# Patient Record
Sex: Male | Born: 1990 | Race: Black or African American | Hispanic: No | Marital: Single | Smoking: Never smoker
Health system: Southern US, Community
[De-identification: ages and names within clinical notes are randomized; demographics above are authoritative.]

## PROBLEM LIST (undated history)

## (undated) DIAGNOSIS — T148XXA Other injury of unspecified body region, initial encounter: Secondary | ICD-10-CM

## (undated) HISTORY — PX: HYDROCELE EXCISION: SHX482

---
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
## 2010-09-30 ENCOUNTER — Emergency Department (HOSPITAL_COMMUNITY)
Admission: EM | Admit: 2010-09-30 | Discharge: 2010-10-01 | Disposition: A | Discharge status: Home / Self Care | Payer: Self-pay | Attending: Emergency Medicine | Admitting: Emergency Medicine

## 2010-09-30 DIAGNOSIS — N5089 Other specified disorders of the male genital organs: Secondary | ICD-10-CM | POA: Insufficient documentation

## 2010-09-30 DIAGNOSIS — T7840XA Allergy, unspecified, initial encounter: Secondary | ICD-10-CM | POA: Insufficient documentation

## 2010-09-30 DIAGNOSIS — X58XXXA Exposure to other specified factors, initial encounter: Secondary | ICD-10-CM | POA: Insufficient documentation

## 2010-09-30 DIAGNOSIS — L500 Allergic urticaria: Secondary | ICD-10-CM | POA: Insufficient documentation

## 2010-10-01 ENCOUNTER — Emergency Department (HOSPITAL_COMMUNITY): Payer: Self-pay

## 2011-09-24 ENCOUNTER — Encounter (HOSPITAL_COMMUNITY): Payer: Self-pay | Admitting: Emergency Medicine

## 2011-09-24 ENCOUNTER — Emergency Department (HOSPITAL_COMMUNITY)
Admission: EM | Admit: 2011-09-24 | Discharge: 2011-09-24 | Disposition: A | Discharge status: Home / Self Care | Payer: Self-pay | Attending: Emergency Medicine | Admitting: Emergency Medicine

## 2011-09-24 DIAGNOSIS — M545 Low back pain, unspecified: Secondary | ICD-10-CM | POA: Insufficient documentation

## 2011-09-24 DIAGNOSIS — J029 Acute pharyngitis, unspecified: Secondary | ICD-10-CM

## 2011-09-24 DIAGNOSIS — R070 Pain in throat: Secondary | ICD-10-CM | POA: Insufficient documentation

## 2011-09-24 HISTORY — DX: Other injury of unspecified body region, initial encounter: T14.8XXA

## 2011-09-24 LAB — RAPID STREP SCREEN (MED CTR MEBANE ONLY): Streptococcus, Group A Screen (Direct): NEGATIVE

## 2011-09-24 MED ORDER — HYDROCODONE-ACETAMINOPHEN 7.5-500 MG/15ML PO SOLN: 15.0000 mL | Freq: Four times a day (QID) | ORAL | Status: AC | PRN

## 2011-09-24 MED ORDER — HYDROCODONE-ACETAMINOPHEN 7.5-500 MG/15ML PO SOLN
10.0000 mL | Freq: Once | ORAL | Status: AC
  Administered 2011-09-24: 10 mL via ORAL
  Filled 2011-09-24: qty 15

## 2011-09-24 NOTE — ED Notes (Signed)
Pt stated he had a cold 2 weeks ago that went ago. Current complaints are sore throat, hard to swallow. Pt had n/v yesterday. Legs hurting and back is hurting per pt. No appetite.

## 2011-09-24 NOTE — ED Provider Notes (Signed)
History     CSN: [id number]  Arrival date & time 09/24/11  1519   First MD Initiated Contact with Patient 09/24/11 1608      Chief Complaint  Patient presents with  . Sore Throat  . Back Pain  . Chills    (Consider location/radiation/quality/duration/timing/severity/associated sxs/prior treatment) HPI  21 year old male presents complaining of sore throat. Patient states for the past 3 days he has been having fever, chills, body aches, sore throat worsening with swallowing, decrease in appetite, nausea and one bout of nonbilious nonbloody vomit yesterday. He also experiencing body aches especially to his low back. He denies headache, runny nose, sneezing, cough, chest pain, shortness of breath, abdominal pain, or rash. He denies any recent sick contact. He has tried over-the-counter Mucinex and Tylenol PM without adequate relief.  Past Medical History  Diagnosis Date  . Fracture     left wrist     Past Surgical History  Procedure Date  . Hydrocele excision     No family history on file.  History  Substance Use Topics  . Smoking status: Not on file  . Smokeless tobacco: Not on file  . Alcohol Use:       Review of Systems  All other systems reviewed and are negative.    Allergies  Bee venom  Home Medications   Current Outpatient Rx  Name Route Sig Dispense Refill  . ACETAMINOPHEN 500 MG PO TABS Oral Take 500 mg by mouth every 6 (six) hours as needed. Pain    . DM-GUAIFENESIN ER 30-600 MG PO TB12 Oral Take 1 tablet by mouth every 12 (twelve) hours. Cold    . DIPHENHYDRAMINE-APAP (SLEEP) 25-500 MG PO TABS Oral Take 1 tablet by mouth at bedtime as needed. Pain      BP 132/49  Pulse 77  Temp 99.7 F (37.6 C) (Oral)  Resp 16  Ht 5\' 9"  (1.753 m)  Wt 165 lb (74.844 kg)  BMI 24.37 kg/m2  SpO2 100%  Physical Exam  Nursing note and vitals reviewed. Constitutional: He is oriented to person, place, and time. He appears well-developed and well-nourished. No  distress.  HENT:  Head: Normocephalic and atraumatic. No trismus in the jaw.  Right Ear: External ear normal.  Left Ear: External ear normal.  Mouth/Throat: Uvula is midline and mucous membranes are normal. No uvula swelling. Oropharyngeal exudate and posterior oropharyngeal edema present. No posterior oropharyngeal erythema.    Eyes: Conjunctivae are normal. No scleral icterus.  Neck: Normal range of motion. Neck supple. No JVD present. No tracheal deviation present.  Cardiovascular: Normal rate and regular rhythm.   Pulmonary/Chest: Effort normal and breath sounds normal. No stridor. No respiratory distress. He has no wheezes.  Abdominal: Soft. There is no tenderness. There is no rebound and no guarding.  Lymphadenopathy:    He has cervical adenopathy.  Neurological: He is alert and oriented to person, place, and time.  Skin: Skin is warm. No rash noted.    ED Course  Procedures (including critical care time)  Labs Reviewed - No data to display No results found.   No diagnosis found.  Results for orders placed during the hospital encounter of 09/24/11  RAPID STREP SCREEN      Component Value Range   Streptococcus, Group A Screen (Direct) NEGATIVE  NEGATIVE   No results found.    MDM  Patient presents with sore throat. He meets Centor criteria.  Rapid strep test sent.  No airway compromise.  No splenomegaly.  Lortab given.    Pt educate on avoiding using mult. Source of acetaminophen to prevent overdosing.     5:08 PM Strep test negative.  Result discussed.  Care instruction given.  Pt voice understanding.     Fayrene Helper, PA-C 09/24/11 1709

## 2011-09-24 NOTE — ED Provider Notes (Signed)
Medical screening examination/treatment/procedure(s) were performed by non-physician practitioner and as supervising physician I was immediately available for consultation/collaboration.  Juliet Rude. Rubin Payor, MD 09/24/11 2352

## 2011-09-24 NOTE — Discharge Instructions (Signed)
Pharyngitis, Viral and Bacterial  Pharyngitis is soreness (inflammation) or infection of the pharynx. It is also called a sore throat.  CAUSES   Most sore throats are caused by viruses and are part of a cold. However, some sore throats are caused by strep and other bacteria. Sore throats can also be caused by post nasal drip from draining sinuses, allergies and sometimes from sleeping with an open mouth. Infectious sore throats can be spread from person to person by coughing, sneezing and sharing cups or eating utensils.  TREATMENT   Sore throats that are viral usually last 3-4 days. Viral illness will get better without medications (antibiotics). Strep throat and other bacterial infections will usually begin to get better about 24-48 hours after you begin to take antibiotics.  HOME CARE INSTRUCTIONS   · If the caregiver feels there is a bacterial infection or if there is a positive strep test, they will prescribe an antibiotic. The full course of antibiotics must be taken. If the full course of antibiotic is not taken, you or your child may become ill again. If you or your child has strep throat and do not finish all of the medication, serious heart or kidney diseases may develop.  · Drink enough water and fluids to keep your urine clear or pale yellow.  · Only take over-the-counter or prescription medicines for pain, discomfort or fever as directed by your caregiver.  · Get lots of rest.  · Gargle with salt water (½ tsp. of salt in a glass of water) as often as every 1-2 hours as you need for comfort.  · Hard candies may soothe the throat if individual is not at risk for choking. Throat sprays or lozenges may also be used.  SEEK MEDICAL CARE IF:   · Large, tender lumps in the neck develop.  · A rash develops.  · Green, yellow-brown or bloody sputum is coughed up.  · Your baby is older than 3 months with a rectal temperature of 100.5° F (38.1° C) or higher for more than 1 day.  SEEK IMMEDIATE MEDICAL CARE IF:   · A  stiff neck develops.  · You or your child are drooling or unable to swallow liquids.  · You or your child are vomiting, unable to keep medications or liquids down.  · You or your child has severe pain, unrelieved with recommended medications.  · You or your child are having difficulty breathing (not due to stuffy nose).  · You or your child are unable to fully open your mouth.  · You or your child develop redness, swelling, or severe pain anywhere on the neck.  · You have a fever.  · Your baby is older than 3 months with a rectal temperature of 102° F (38.9° C) or higher.  · Your baby is 3 months old or younger with a rectal temperature of 100.4° F (38° C) or higher.  MAKE SURE YOU:   · Understand these instructions.  · Will watch your condition.  · Will get help right away if you are not doing well or get worse.  Document Released: 03/23/2005 Document Revised: 03/12/2011 Document Reviewed: 06/20/2007  [employer]® Patient Information ©2012 [employer].  Salt Water Gargle  This solution will help make your mouth and throat feel better.  HOME CARE INSTRUCTIONS   · Mix 1 teaspoon of salt in 8 ounces of warm water.  · Gargle with this solution as much or often as you need or as directed. Swish and   gargle gently if you have any sores or wounds in your mouth.  · Do not swallow this mixture.  Document Released: 12/26/2003 Document Revised: 03/12/2011 Document Reviewed: 05/18/2008  ExitCare® Patient Information ©2012 [employer].

## 2012-11-28 ENCOUNTER — Emergency Department (HOSPITAL_COMMUNITY): Payer: No Typology Code available for payment source

## 2012-11-28 ENCOUNTER — Emergency Department (HOSPITAL_COMMUNITY)
Admission: EM | Admit: 2012-11-28 | Discharge: 2012-11-28 | Disposition: A | Discharge status: Home / Self Care | Payer: No Typology Code available for payment source | Attending: Emergency Medicine | Admitting: Emergency Medicine

## 2012-11-28 ENCOUNTER — Encounter (HOSPITAL_COMMUNITY): Payer: Self-pay | Admitting: Emergency Medicine

## 2012-11-28 DIAGNOSIS — M542 Cervicalgia: Secondary | ICD-10-CM

## 2012-11-28 DIAGNOSIS — Y9389 Activity, other specified: Secondary | ICD-10-CM | POA: Insufficient documentation

## 2012-11-28 DIAGNOSIS — M62838 Other muscle spasm: Secondary | ICD-10-CM

## 2012-11-28 DIAGNOSIS — M5380 Other specified dorsopathies, site unspecified: Secondary | ICD-10-CM | POA: Insufficient documentation

## 2012-11-28 DIAGNOSIS — S0993XA Unspecified injury of face, initial encounter: Secondary | ICD-10-CM | POA: Insufficient documentation

## 2012-11-28 DIAGNOSIS — Z8781 Personal history of (healed) traumatic fracture: Secondary | ICD-10-CM | POA: Insufficient documentation

## 2012-11-28 DIAGNOSIS — Y92410 Unspecified street and highway as the place of occurrence of the external cause: Secondary | ICD-10-CM | POA: Insufficient documentation

## 2012-11-28 MED ORDER — DIAZEPAM 5 MG PO TABS: 5.0000 mg | ORAL_TABLET | Freq: Once | ORAL | Status: DC

## 2012-11-28 MED ORDER — HYDROCODONE-ACETAMINOPHEN 5-325 MG PO TABS: 2.0000 | ORAL_TABLET | Freq: Once | ORAL | Status: DC

## 2012-11-28 MED ORDER — DIAZEPAM 5 MG PO TABS
5.0000 mg | ORAL_TABLET | Freq: Once | ORAL | Status: AC
  Administered 2012-11-28: 5 mg via ORAL
  Filled 2012-11-28: qty 1

## 2012-11-28 MED ORDER — HYDROCODONE-ACETAMINOPHEN 5-325 MG PO TABS
2.0000 | ORAL_TABLET | Freq: Once | ORAL | Status: AC
  Administered 2012-11-28: 2 via ORAL
  Filled 2012-11-28: qty 2
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
## 2013-03-07 ENCOUNTER — Ambulatory Visit (INDEPENDENT_AMBULATORY_CARE_PROVIDER_SITE_OTHER): Payer: Managed Care, Other (non HMO) | Admitting: Family Medicine

## 2013-03-07 VITALS — BP 108/76 | HR 50 | Temp 97.7°F | Resp 16 | Ht 69.0 in | Wt 170.4 lb

## 2013-03-07 DIAGNOSIS — T7840XA Allergy, unspecified, initial encounter: Secondary | ICD-10-CM

## 2013-03-07 DIAGNOSIS — N50819 Testicular pain, unspecified: Secondary | ICD-10-CM

## 2013-03-07 DIAGNOSIS — L500 Allergic urticaria: Secondary | ICD-10-CM

## 2013-03-07 DIAGNOSIS — N509 Disorder of male genital organs, unspecified: Secondary | ICD-10-CM

## 2013-03-07 DIAGNOSIS — Z79899 Other long term (current) drug therapy: Secondary | ICD-10-CM

## 2013-03-07 LAB — GLUCOSE, POCT (MANUAL RESULT ENTRY): POC Glucose: 88 mg/dl (ref 70–99)

## 2013-03-07 MED ORDER — NON FORMULARY
150.0000 mg | Freq: Once | Status: AC
  Administered 2013-03-07: 150 mg via ORAL

## 2013-03-07 MED ORDER — METHYLPREDNISOLONE ACETATE 80 MG/ML IJ SUSP
120.0000 mg | Freq: Once | INTRAMUSCULAR | Status: AC
  Administered 2013-03-07: 120 mg via INTRAMUSCULAR

## 2013-03-07 MED ORDER — DIPHENHYDRAMINE HCL 50 MG/ML IJ SOLN
50.0000 mg | Freq: Once | INTRAMUSCULAR | Status: AC
  Administered 2013-03-07: 50 mg via INTRAVENOUS

## 2013-03-07 NOTE — Patient Instructions (Addendum)
Continue the benadryl 2 pills every 4 hours as needed today and into tomorrow.  The steroid should be helping with the reaction by the morning. Continue zantac over the counter - 150mg  twice per day next 5-7 days. Avoid contact with soap that may have led to your reaction, but Return to the clinic or go to the nearest emergency room if any of your symptoms worsen or new symptoms occur.  We will check a urine test to make sure the soreness in the testicle is not due to an infection, but if this soreness in the actual testicle does not improve, or certainly if any worsening tonight - go to the emergency room.  Contact Dermatitis Contact dermatitis is a reaction to certain substances that touch the skin. Contact dermatitis can be either irritant contact dermatitis or allergic contact dermatitis. Irritant contact dermatitis does not require previous exposure to the substance for a reaction to occur.Allergic contact dermatitis only occurs if you have been exposed to the substance before. Upon a repeat exposure, your body reacts to the substance.  CAUSES  Many substances can cause contact dermatitis. Irritant dermatitis is most commonly caused by repeated exposure to mildly irritating substances, such as:  Makeup.  Soaps.  Detergents.  Bleaches.  Acids.  Metal salts, such as nickel. Allergic contact dermatitis is most commonly caused by exposure to:  Poisonous plants.  Chemicals (deodorants, shampoos).  Jewelry.  Latex.  Neomycin in triple antibiotic cream.  Preservatives in products, including clothing. SYMPTOMS  The area of skin that is exposed may develop:  Dryness or flaking.  Redness.  Cracks.  Itching.  Pain or a burning sensation.  Blisters. With allergic contact dermatitis, there may also be swelling in areas such as the eyelids, mouth, or genitals.  DIAGNOSIS  Your caregiver can usually tell what the problem is by doing a physical exam. In cases where the cause is  uncertain and an allergic contact dermatitis is suspected, a patch skin test may be performed to help determine the cause of your dermatitis. TREATMENT Treatment includes protecting the skin from further contact with the irritating substance by avoiding that substance if possible. Barrier creams, powders, and gloves may be helpful. Your caregiver may also recommend:  Steroid creams or ointments applied 2 times daily. For best results, soak the rash area in cool water for 20 minutes. Then apply the medicine. Cover the area with a plastic wrap. You can store the steroid cream in the refrigerator for a "chilly" effect on your rash. That may decrease itching. Oral steroid medicines may be needed in more severe cases.  Antibiotics or antibacterial ointments if a skin infection is present.  Antihistamine lotion or an antihistamine taken by mouth to ease itching.  Lubricants to keep moisture in your skin.  Burow's solution to reduce redness and soreness or to dry a weeping rash. Mix one packet or tablet of solution in 2 cups cool water. Dip a clean washcloth in the mixture, wring it out a bit, and put it on the affected area. Leave the cloth in place for 30 minutes. Do this as often as possible throughout the day.  Taking several cornstarch or baking soda baths daily if the area is too large to cover with a washcloth. Harsh chemicals, such as alkalis or acids, can cause skin damage that is like a burn. You should flush your skin for 15 to 20 minutes with cold water after such an exposure. You should also seek immediate medical care after exposure. Bandages (  dressings), antibiotics, and pain medicine may be needed for severely irritated skin.  HOME CARE INSTRUCTIONS  Avoid the substance that caused your reaction.  Keep the area of skin that is affected away from hot water, soap, sunlight, chemicals, acidic substances, or anything else that would irritate your skin.  Do not scratch the rash. Scratching  may cause the rash to become infected.  You may take cool baths to help stop the itching.  Only take over-the-counter or prescription medicines as directed by your caregiver.  See your caregiver for follow-up care as directed to make sure your skin is healing properly. SEEK MEDICAL CARE IF:   Your condition is not better after 3 days of treatment.  You seem to be getting worse.  You see signs of infection such as swelling, tenderness, redness, soreness, or warmth in the affected area.  You have any problems related to your medicines. Document Released: 03/20/2000 Document Revised: 06/15/2011 Document Reviewed: 08/26/2010 [facility] Patient Information 2014 [place], [place]. Hives Hives are itchy, red, swollen areas of the skin. They can vary in size and location on your body. Hives can come and go for hours or several days (acute hives) or for several weeks (chronic hives). Hives do not spread from person to person (noncontagious). They may get worse with scratching, exercise, and emotional stress. CAUSES   Allergic reaction to food, additives, or drugs.  Infections, including the common cold.  Illness, such as vasculitis, lupus, or thyroid disease.  Exposure to sunlight, heat, or cold.  Exercise.  Stress.  Contact with chemicals. SYMPTOMS   Red or white swollen patches on the skin. The patches may change size, shape, and location quickly and repeatedly.  Itching.  Swelling of the hands, feet, and face. This may occur if hives develop deeper in the skin. DIAGNOSIS  Your caregiver can usually tell what is wrong by performing a physical exam. Skin or blood tests may also be done to determine the cause of your hives. In some cases, the cause cannot be determined. TREATMENT  Mild cases usually get better with medicines such as antihistamines. Severe cases may require an emergency epinephrine injection. If the cause of your hives is known, treatment includes avoiding that  trigger.  HOME CARE INSTRUCTIONS   Avoid causes that trigger your hives.  Take antihistamines as directed by your caregiver to reduce the severity of your hives. Non-sedating or low-sedating antihistamines are usually recommended. Do not drive while taking an antihistamine.  Take any other medicines prescribed for itching as directed by your caregiver.  Wear loose-fitting clothing.  Keep all follow-up appointments as directed by your caregiver. SEEK MEDICAL CARE IF:   You have persistent or severe itching that is not relieved with medicine.  You have painful or swollen joints. SEEK IMMEDIATE MEDICAL CARE IF:   You have a fever.  Your tongue or lips are swollen.  You have trouble breathing or swallowing.  You feel tightness in the throat or chest.  You have abdominal pain. These problems may be the first sign of a life-threatening allergic reaction. Call your local emergency services (911 in [place] MAKE SURE YOU:   Understand these instructions.  Will watch your condition.  Will get help right away if you are not doing well or get worse. Document Released: 03/23/2005 Document Revised: 09/22/2011 Document Reviewed: 06/16/2011 [facility] Patient Information 2014 [place], [place].

## 2013-03-07 NOTE — Progress Notes (Addendum)
Subjective:    Patient ID: Dustin Yang, male    DOB: 12/31/1990, 22 y.o.   MRN: [id number]  This chart was scribed for Meredith Staggers, MD by Greggory Stallion, Medical Scribe. This patient's care was started at 3:16 PM.  HPI HPI Comments: Dustin Yang is a 22 y.o. male who presents to the office complaining of itchy rash on his genitals with associated scrotal swelling that started around 11:40 AM today. He states the rash started on his scrotum then moved to his penis, legs, back and arms. Pt states he was SOB in the waiting room but denies it currently. He started using his girlfriend's Olay body wash recently. Denies using any other new soaps or lotions. Pt also took two muscle relaxer's this morning that his chiropractor prescribed him but states he has taken them before in the past. He had similar symptoms such as these about one year ago due to sensitive skin. Denies trouble breathing, no mouth lesions.   Monogamous with GF, no hx of STI's.  Testicle/scrotum only sore past few hours with current sx's.   There are no active problems to display for this patient.  Past Medical History  Diagnosis Date  . Fracture     left wrist    Past Surgical History  Procedure Laterality Date  . Hydrocele excision     Allergies  Allergen Reactions  . Bee Venom Anaphylaxis   Prior to Admission medications   Medication Sig Start Date End Date Taking? Authorizing Provider  diazepam (VALIUM) 5 MG tablet Take 1 tablet (5 mg total) by mouth once. 11/28/12   Dagmar Hait, MD  HYDROcodone-acetaminophen (NORCO/VICODIN) 5-325 MG per tablet Take 2 tablets by mouth once. 11/28/12   Dagmar Hait, MD   History   Social History  . Marital Status: Single    Spouse Name: N/A    Number of Children: N/A  . Years of Education: N/A   Occupational History  . Not on file.   Social History Main Topics  . Smoking status: Never Smoker   . Smokeless tobacco: Never Used  . Alcohol Use: No  .  Drug Use: 3.00 per week    Special: Marijuana  . Sexual Activity: Not on file   Other Topics Concern  . Not on file   Social History Narrative  . No narrative on file    Review of Systems  HENT: Negative for mouth sores.   Respiratory: Negative for shortness of breath.   Genitourinary: Positive for scrotal swelling.  Skin: Positive for rash.       Objective:   Physical Exam  Vitals reviewed. Constitutional: He is oriented to person, place, and time. He appears well-developed and well-nourished. No distress.  HENT:  Head: Normocephalic and atraumatic.  No mucosal lesions. Swallowing secretions normally.   Eyes: EOM are normal.  Cardiovascular: Normal rate, regular rhythm and normal heart sounds.   No murmur heard. Pulmonary/Chest: Effort normal and breath sounds normal. No stridor. He has no wheezes. He has no rhonchi.  No stridor.  Genitourinary:  No focal lesions noted on scrotum. Diffuse scrotal edema. Slight soreness to left testicles/epididymus. No penile swelling or discharge.   Lymphadenopathy:    He has no cervical adenopathy.  Neurological: He is alert and oriented to person, place, and time.  Skin: Skin is warm and dry. Rash noted.  Urticaria on medial thighs bilaterally. Few around the scrotum, right and left buttocks. A few small urticaria on abdomen with some  excoriation. Diffuse urticaria across back with dermatographism of upper back.   Psychiatric: He has a normal mood and affect. His behavior is normal.    Filed Vitals:   03/07/13 1319  BP: 108/76  Pulse: 50  Temp: 97.7 F (36.5 C)  TempSrc: Oral  Resp: 16  Height: 5\' 9"  (1.753 m)  Weight: 170 lb 6.4 oz (77.293 kg)  SpO2: 99%   Results for orders placed in visit on 03/07/13  GLUCOSE, POCT (MANUAL RESULT ENTRY)      Result Value Range   POC Glucose 88  70 - 99 mg/dl   2nd md exam performed.    Recheck at 1640: less itching, feels better, no resp sx's. Still with edema of scrotum, no discharge  noted. Still slight L testicular/epididymal ttp.   Assessment & Plan:   Dustin Yang is a 22 y.o. male Allergic reaction, initial encounter - Plan: diphenhydrAMINE (BENADRYL) injection 50 mg, NON FORMULARY 150 mg, methylPREDNISolone acetate (DEPO-MEDROL) injection 120 mg, Allergic urticaria - Plan: diphenhydrAMINE (BENADRYL) injection 50 mg, NON FORMULARY 150 mg, methylPREDNISolone acetate (DEPO-MEDROL) injection 120 mg.   - suspected contact derm/allergic urticaria from soap? With scrotal edema thought to be due to allergic reaction at present. Slight L epididymal ttp, but no hx of injury, and more of scrotal edema. Advised if testicular pain persists tonight, or any worsening - to go to ER for eval and possible doppler. understanding expressed.   High risk medication use - Plan: POCT glucose (manual entry) ok - Depomedrol as above given.   Testicular pain - Plan: GC/Chlamydia Probe Amp, appears to be more scrotal ttp with allergic reaction/urticaria and no hx of twisting or injury, but advised if pain persists or certainly worsens in actual testicle tonight - to go to ER.    Meds ordered this encounter  Medications  . diphenhydrAMINE (BENADRYL) injection 50 mg    Sig:   . NON FORMULARY 150 mg    Sig:     Order Specific Question:  Brand Name:    Answer:  Zantac    Order Specific Question:  Generic name:    Answer:  Ranitidine    Order Specific Question:  Form:    Answer:  Tab [55]    Order Specific Question:  Length of Therapy:    Answer:  1 day    Order Specific Question:  Reason for Non-Formulary    Answer:  not orderable in Epic    Order Specific Question:  How soon needed? (normally 72 hrs needed to procure):    Answer:  0-24 hrs  . methylPREDNISolone acetate (DEPO-MEDROL) injection 120 mg    Sig:     Patient Instructions  Continue the benadryl 2 pills every 4 hours as needed today and into tomorrow.  The steroid should be helping with the reaction by the morning. Continue  zantac over the counter - 150mg  twice per day next 5-7 days. Avoid contact with soap that may have led to your reaction, but Return to the clinic or go to the nearest emergency room if any of your symptoms worsen or new symptoms occur.  We will check a urine test to make sure the soreness in the testicle is not due to an infection, but if this soreness in the actual testicle does not improve, or certainly if any worsening tonight - go to the emergency room.  Contact Dermatitis Contact dermatitis is a reaction to certain substances that touch the skin. Contact dermatitis can be either irritant contact  dermatitis or allergic contact dermatitis. Irritant contact dermatitis does not require previous exposure to the substance for a reaction to occur.Allergic contact dermatitis only occurs if you have been exposed to the substance before. Upon a repeat exposure, your body reacts to the substance.  CAUSES  Many substances can cause contact dermatitis. Irritant dermatitis is most commonly caused by repeated exposure to mildly irritating substances, such as:  Makeup.  Soaps.  Detergents.  Bleaches.  Acids.  Metal salts, such as nickel. Allergic contact dermatitis is most commonly caused by exposure to:  Poisonous plants.  Chemicals (deodorants, shampoos).  Jewelry.  Latex.  Neomycin in triple antibiotic cream.  Preservatives in products, including clothing. SYMPTOMS  The area of skin that is exposed may develop:  Dryness or flaking.  Redness.  Cracks.  Itching.  Pain or a burning sensation.  Blisters. With allergic contact dermatitis, there may also be swelling in areas such as the eyelids, mouth, or genitals.  DIAGNOSIS  Your caregiver can usually tell what the problem is by doing a physical exam. In cases where the cause is uncertain and an allergic contact dermatitis is suspected, a patch skin test may be performed to help determine the cause of your  dermatitis. TREATMENT Treatment includes protecting the skin from further contact with the irritating substance by avoiding that substance if possible. Barrier creams, powders, and gloves may be helpful. Your caregiver may also recommend:  Steroid creams or ointments applied 2 times daily. For best results, soak the rash area in cool water for 20 minutes. Then apply the medicine. Cover the area with a plastic wrap. You can store the steroid cream in the refrigerator for a "chilly" effect on your rash. That may decrease itching. Oral steroid medicines may be needed in more severe cases.  Antibiotics or antibacterial ointments if a skin infection is present.  Antihistamine lotion or an antihistamine taken by mouth to ease itching.  Lubricants to keep moisture in your skin.  Burow's solution to reduce redness and soreness or to dry a weeping rash. Mix one packet or tablet of solution in 2 cups cool water. Dip a clean washcloth in the mixture, wring it out a bit, and put it on the affected area. Leave the cloth in place for 30 minutes. Do this as often as possible throughout the day.  Taking several cornstarch or baking soda baths daily if the area is too large to cover with a washcloth. Harsh chemicals, such as alkalis or acids, can cause skin damage that is like a burn. You should flush your skin for 15 to 20 minutes with cold water after such an exposure. You should also seek immediate medical care after exposure. Bandages (dressings), antibiotics, and pain medicine may be needed for severely irritated skin.  HOME CARE INSTRUCTIONS  Avoid the substance that caused your reaction.  Keep the area of skin that is affected away from hot water, soap, sunlight, chemicals, acidic substances, or anything else that would irritate your skin.  Do not scratch the rash. Scratching may cause the rash to become infected.  You may take cool baths to help stop the itching.  Only take over-the-counter or  prescription medicines as directed by your caregiver.  See your caregiver for follow-up care as directed to make sure your skin is healing properly. SEEK MEDICAL CARE IF:   Your condition is not better after 3 days of treatment.  You seem to be getting worse.  You see signs of infection such as swelling, tenderness,  redness, soreness, or warmth in the affected area.  You have any problems related to your medicines. Document Released: 03/20/2000 Document Revised: 06/15/2011 Document Reviewed: 08/26/2010 [facility] Patient Information 2014 [place], [place]. Hives Hives are itchy, red, swollen areas of the skin. They can vary in size and location on your body. Hives can come and go for hours or several days (acute hives) or for several weeks (chronic hives). Hives do not spread from person to person (noncontagious). They may get worse with scratching, exercise, and emotional stress. CAUSES   Allergic reaction to food, additives, or drugs.  Infections, including the common cold.  Illness, such as vasculitis, lupus, or thyroid disease.  Exposure to sunlight, heat, or cold.  Exercise.  Stress.  Contact with chemicals. SYMPTOMS   Red or white swollen patches on the skin. The patches may change size, shape, and location quickly and repeatedly.  Itching.  Swelling of the hands, feet, and face. This may occur if hives develop deeper in the skin. DIAGNOSIS  Your caregiver can usually tell what is wrong by performing a physical exam. Skin or blood tests may also be done to determine the cause of your hives. In some cases, the cause cannot be determined. TREATMENT  Mild cases usually get better with medicines such as antihistamines. Severe cases may require an emergency epinephrine injection. If the cause of your hives is known, treatment includes avoiding that trigger.  HOME CARE INSTRUCTIONS   Avoid causes that trigger your hives.  Take antihistamines as directed by your caregiver to  reduce the severity of your hives. Non-sedating or low-sedating antihistamines are usually recommended. Do not drive while taking an antihistamine.  Take any other medicines prescribed for itching as directed by your caregiver.  Wear loose-fitting clothing.  Keep all follow-up appointments as directed by your caregiver. SEEK MEDICAL CARE IF:   You have persistent or severe itching that is not relieved with medicine.  You have painful or swollen joints. SEEK IMMEDIATE MEDICAL CARE IF:   You have a fever.  Your tongue or lips are swollen.  You have trouble breathing or swallowing.  You feel tightness in the throat or chest.  You have abdominal pain. These problems may be the first sign of a life-threatening allergic reaction. Call your local emergency services (911 in [place] MAKE SURE YOU:   Understand these instructions.  Will watch your condition.  Will get help right away if you are not doing well or get worse. Document Released: 03/23/2005 Document Revised: 09/22/2011 Document Reviewed: 06/16/2011 [facility] Patient Information 2014 [place], [place].        I personally performed the services described in this documentation, which was scribed in my presence. The recorded information has been reviewed and considered, and addended by me as needed.

## 2013-03-08 LAB — GC/CHLAMYDIA PROBE AMP: CT Probe RNA: NEGATIVE

## 2013-03-11 ENCOUNTER — Encounter: Payer: Self-pay | Admitting: *Deleted

## 2013-03-12 ENCOUNTER — Telehealth: Payer: Self-pay

## 2013-03-12 NOTE — Telephone Encounter (Signed)
Patient was returning call he received this morning around 9:30.  He said it would be best if someone could call him around 4 when he is out of work.

## 2013-03-16 NOTE — Telephone Encounter (Signed)
Lab had sent pt an unable to reach letter. I called and LMOM for pt to CB and ask for lab results.

## 2013-03-18 ENCOUNTER — Telehealth: Payer: Self-pay

## 2013-03-18 NOTE — Telephone Encounter (Signed)
Patient called about lab results and stated that he had a missed call earlier today. Patient would like a returned phone call about the results if they are ready. He says the best time to call would be Monday after 10am if the call would be on Monday. Call this weekend if possible.

## 2013-03-20 NOTE — Telephone Encounter (Signed)
G/C testing negative. Called him to advise.
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
## 2015-09-08 ENCOUNTER — Emergency Department (HOSPITAL_COMMUNITY)
Admission: EM | Admit: 2015-09-08 | Discharge: 2015-09-08 | Disposition: A | Discharge status: Home / Self Care | Payer: 59 | Attending: Emergency Medicine | Admitting: Emergency Medicine

## 2015-09-08 ENCOUNTER — Encounter (HOSPITAL_COMMUNITY): Payer: Self-pay

## 2015-09-08 DIAGNOSIS — J029 Acute pharyngitis, unspecified: Secondary | ICD-10-CM | POA: Insufficient documentation

## 2015-09-08 LAB — RAPID STREP SCREEN (MED CTR MEBANE ONLY): STREPTOCOCCUS, GROUP A SCREEN (DIRECT): NEGATIVE

## 2015-09-08 MED ORDER — ACETAMINOPHEN 325 MG PO TABS
650.0000 mg | ORAL_TABLET | Freq: Once | ORAL | Status: AC | PRN
  Administered 2015-09-08: 650 mg via ORAL
  Filled 2015-09-08: qty 2

## 2015-09-08 MED ORDER — HYDROCODONE-ACETAMINOPHEN 5-325 MG PO TABS: 1.0000 | ORAL_TABLET | ORAL | Status: AC | PRN

## 2015-09-08 MED ORDER — METOCLOPRAMIDE HCL 10 MG PO TABS: 10.0000 mg | ORAL_TABLET | Freq: Four times a day (QID) | ORAL | Status: AC | PRN

## 2015-09-08 NOTE — ED Notes (Signed)
Bed: [id number] Expected date:  Expected time:  Means of arrival:  Comments: RM 32

## 2015-09-08 NOTE — ED Provider Notes (Signed)
CSN: [id number]     Arrival date & time 09/08/15  1752 History   First MD Initiated Contact with Patient 09/08/15 1940     Chief Complaint  Patient presents with  . Fever  . Sore Throat     (Consider location/radiation/quality/duration/timing/severity/associated sxs/prior Treatment) HPI Complains of sore throat, fever with maximum temperature 101 onset 2 days ago. Pain is worse with swallowing. Not improved by anything. Moderate at present. Associated symptoms include diffuse myalgias. No cough. he vomited one time today and one time yesterday. No nausea at present. Treated with TheraFlu, without relief. No other associated symptoms Past Medical History  Diagnosis Date  . Fracture     left wrist    Past Surgical History  Procedure Laterality Date  . Hydrocele excision     History reviewed. No pertinent family history. Social History  Substance Use Topics  . Smoking status: Never Smoker   . Smokeless tobacco: Never Used  . Alcohol Use: No    Review of Systems  Constitutional: Positive for fever.  HENT: Positive for sore throat.   Respiratory: Negative.   Cardiovascular: Negative.   Gastrointestinal: Negative.   Musculoskeletal: Positive for myalgias.  Skin: Negative.   Neurological: Negative.   Psychiatric/Behavioral: Negative.   All other systems reviewed and are negative.     Allergies  Bee venom  Home Medications   Prior to Admission medications   Medication Sig Start Date End Date Taking? Authorizing Provider  diazepam (VALIUM) 5 MG tablet Take 1 tablet (5 mg total) by mouth once. 11/28/12   Elwin Mocha, MD  HYDROcodone-acetaminophen (NORCO/VICODIN) 5-325 MG per tablet Take 2 tablets by mouth once. 11/28/12   Elwin Mocha, MD   BP 148/69 mmHg  Pulse 94  Temp(Src) 99.3 F (37.4 C) (Oral)  Resp 20  SpO2 99% Physical Exam  Constitutional: He is oriented to person, place, and time. He appears well-developed and well-nourished. No distress.  HENT:  Head:  Normocephalic and atraumatic.  Oral pharynx reddened. Uvula midline. No exudate  Eyes: Conjunctivae are normal. Pupils are equal, round, and reactive to light.  Neck: Neck supple. No tracheal deviation present. No thyromegaly present.  Tender anterior cervical lymph nodes bilaterally  Cardiovascular: Normal rate and regular rhythm.   No murmur heard. Pulmonary/Chest: Effort normal and breath sounds normal.  Abdominal: Soft. Bowel sounds are normal. He exhibits no distension. There is no tenderness.  Musculoskeletal: Normal range of motion. He exhibits no edema or tenderness.  Lymphadenopathy:    He has cervical adenopathy.  Neurological: He is alert and oriented to person, place, and time. No cranial nerve deficit. Coordination normal.  Skin: Skin is warm and dry. No rash noted.  Psychiatric: He has a normal mood and affect.  Nursing note and vitals reviewed.   ED Course  Procedures (including critical care time) Labs Review Labs Reviewed - No data to display  Imaging Review No results found. I have personally reviewed and evaluated these images and lab results as part of my medical decision-making.   EKG Interpretation None     Patient administered Tylenol here. Declines further pain medicine 8:20 PM resting comfortably. No distress. Results for orders placed or performed during the hospital encounter of 09/08/15  Rapid strep screen  Result Value Ref Range   Streptococcus, Group A Screen (Direct) NEGATIVE NEGATIVE   No results found.  MDM  Plan prescription Norco, Reglan. Follow-up with PMD if not improving in a week. Encourage oral hydration Final diagnoses:  None  Diagnosis  pharyngitis      Doug SouSam Madina Galati, MD 09/08/15 2028

## 2015-09-08 NOTE — ED Notes (Signed)
Patient d/c'd self care.  F/U and medications reviewed.  Patient verbalized understanding. 

## 2015-09-08 NOTE — ED Notes (Signed)
Pt here with body aches, fever, sore throat x 2 days.  Pt last pain meds was last night.  Took tylenol pm.  Difficult to swallow.  Only eating apples.

## 2015-09-08 NOTE — Discharge Instructions (Signed)
Pharyngitis Take Tylenol for mild pain or the pain medicine prescribed for bad pain. Don't take Tylenol together with the pain medicine prescribed as the combination can be dangerous to your liver. You've also been prescribed medication for nausea. Take it as needed. Return if unable to hold down fluids without vomiting after taking the medication prescribed, or if concerned for any reason. [facility] if not feeling improved in a week. Drink at least six 8 ounce glasses of water daily. Pharyngitis is redness, pain, and swelling (inflammation) of your pharynx.  CAUSES  Pharyngitis is usually caused by infection. Most of the time, these infections are from viruses (viral) and are part of a cold. However, sometimes pharyngitis is caused by bacteria (bacterial). Pharyngitis can also be caused by allergies. Viral pharyngitis may be spread from person to person by coughing, sneezing, and personal items or utensils (cups, forks, spoons, toothbrushes). Bacterial pharyngitis may be spread from person to person by more intimate contact, such as kissing.  SIGNS AND SYMPTOMS  Symptoms of pharyngitis include:   Sore throat.   Tiredness (fatigue).   Low-grade fever.   Headache.  Joint pain and muscle aches.  Skin rashes.  Swollen lymph nodes.  Plaque-like film on throat or tonsils (often seen with bacterial pharyngitis). DIAGNOSIS  Your health care provider will ask you questions about your illness and your symptoms. Your medical history, along with a physical exam, is often all that is needed to diagnose pharyngitis. Sometimes, a rapid strep test is done. Other lab tests may also be done, depending on the suspected cause.  TREATMENT  Viral pharyngitis will usually get better in 3-4 days without the use of medicine. Bacterial pharyngitis is treated with medicines that kill germs (antibiotics).  HOME CARE INSTRUCTIONS   Drink enough water and fluids to keep your urine clear or pale  yellow.   Only take over-the-counter or prescription medicines as directed by your health care provider:   If you are prescribed antibiotics, make sure you finish them even if you start to feel better.   Do not take aspirin.   Get lots of rest.   Gargle with 8 oz of salt water ( tsp of salt per 1 qt of water) as often as every 1-2 hours to soothe your throat.   Throat lozenges (if you are not at risk for choking) or sprays may be used to soothe your throat. SEEK MEDICAL CARE IF:   You have large, tender lumps in your neck.  You have a rash.  You cough up green, yellow-brown, or bloody spit. SEEK IMMEDIATE MEDICAL CARE IF:   Your neck becomes stiff.  You drool or are unable to swallow liquids.  You vomit or are unable to keep medicines or liquids down.  You have severe pain that does not go away with the use of recommended medicines.  You have trouble breathing (not caused by a stuffy nose). MAKE SURE YOU:   Understand these instructions.  Will watch your condition.  Will get help right away if you are not doing well or get worse.   This information is not intended to replace advice given to you by your health care provider. Make sure you discuss any questions you have with your health care provider.   Document Released: 03/23/2005 Document Revised: 01/11/2013 Document Reviewed: 11/28/2012 [employer] Interactive Patient Education [employer]2016 [employer]

## 2015-09-11 LAB — CULTURE, GROUP A STREP (THRC)

## 2024-02-16 ENCOUNTER — Emergency Department (HOSPITAL_BASED_OUTPATIENT_CLINIC_OR_DEPARTMENT_OTHER)
Admission: EM | Admit: 2024-02-16 | Discharge: 2024-02-16 | Disposition: A | Discharge status: Home / Self Care | Attending: Emergency Medicine | Admitting: Emergency Medicine

## 2024-02-16 ENCOUNTER — Other Ambulatory Visit: Payer: Self-pay

## 2024-02-16 ENCOUNTER — Encounter (HOSPITAL_BASED_OUTPATIENT_CLINIC_OR_DEPARTMENT_OTHER): Payer: Self-pay

## 2024-02-16 DIAGNOSIS — M940 Chondrocostal junction syndrome [Tietze]: Secondary | ICD-10-CM | POA: Diagnosis not present

## 2024-02-16 DIAGNOSIS — R1110 Vomiting, unspecified: Secondary | ICD-10-CM | POA: Diagnosis present

## 2024-02-16 DIAGNOSIS — K529 Noninfective gastroenteritis and colitis, unspecified: Secondary | ICD-10-CM | POA: Diagnosis not present

## 2024-02-16 LAB — URINALYSIS, ROUTINE W REFLEX MICROSCOPIC
Bacteria, UA: NONE SEEN
Glucose, UA: NEGATIVE mg/dL
Ketones, ur: 40 mg/dL — AB
Leukocytes,Ua: NEGATIVE
Nitrite: NEGATIVE
Protein, ur: 30 mg/dL — AB
Specific Gravity, Urine: 1.038 — ABNORMAL HIGH (ref 1.005–1.030)
pH: 5.5 (ref 5.0–8.0)

## 2024-02-16 LAB — COMPREHENSIVE METABOLIC PANEL WITH GFR
ALT: 9 U/L (ref 0–44)
AST: 29 U/L (ref 15–41)
Albumin: 4.9 g/dL (ref 3.5–5.0)
Alkaline Phosphatase: 125 U/L (ref 38–126)
Anion gap: 13 (ref 5–15)
BUN: 24 mg/dL — ABNORMAL HIGH (ref 6–20)
CO2: 24 mmol/L (ref 22–32)
Calcium: 9.9 mg/dL (ref 8.9–10.3)
Chloride: 102 mmol/L (ref 98–111)
Creatinine, Ser: 1.24 mg/dL (ref 0.61–1.24)
GFR, Estimated: >60 mL/min (ref >60)
Glucose, Bld: 131 mg/dL — ABNORMAL HIGH (ref 70–99)
Potassium: 3.8 mmol/L (ref 3.5–5.1)
Sodium: 138 mmol/L (ref 135–145)
Total Bilirubin: 0.7 mg/dL (ref 0.0–1.2)
Total Protein: 8.6 g/dL — ABNORMAL HIGH (ref 6.5–8.1)

## 2024-02-16 LAB — CBC
HCT: 47.4 % (ref 39.0–52.0)
Hemoglobin: 16.2 g/dL (ref 13.0–17.0)
MCH: 32.5 pg (ref 26.0–34.0)
MCHC: 34.2 g/dL (ref 30.0–36.0)
MCV: 95 fL (ref 80.0–100.0)
Platelets: 238 K/uL (ref 150–400)
RBC: 4.99 MIL/uL (ref 4.22–5.81)
RDW: 11.7 % (ref 11.5–15.5)
WBC: 4.6 K/uL (ref 4.0–10.5)
nRBC: 0 % (ref 0.0–0.2)

## 2024-02-16 LAB — LIPASE, BLOOD: Lipase: 15 U/L (ref 11–51)

## 2024-02-16 MED ORDER — ONDANSETRON HCL 4 MG/2ML IJ SOLN
4.0000 mg | Freq: Once | INTRAMUSCULAR | Status: AC | PRN
  Administered 2024-02-16: 4 mg via INTRAVENOUS
  Filled 2024-02-16: qty 2

## 2024-02-16 MED ORDER — ONDANSETRON 4 MG PO TBDP: 4.0000 mg | ORAL_TABLET | Freq: Four times a day (QID) | ORAL | 0 refills | Status: AC | PRN

## 2024-02-16 MED ORDER — LIDOCAINE 5 % EX PTCH: 1.0000 | MEDICATED_PATCH | CUTANEOUS | Status: DC

## 2024-02-16 MED ORDER — LIDOCAINE 5 % EX PTCH
1.0000 | MEDICATED_PATCH | CUTANEOUS | Status: DC
  Administered 2024-02-16: 1 via TRANSDERMAL
  Filled 2024-02-16: qty 1

## 2024-02-16 MED ORDER — SODIUM CHLORIDE 0.9 % IV BOLUS: 1000.0000 mL | Freq: Once | INTRAVENOUS | Status: DC

## 2024-02-16 NOTE — ED Provider Notes (Signed)
  EMERGENCY DEPARTMENT AT [facility] Provider Note   CSN: [id number] Arrival date & time: 02/16/24  1425     Patient presents with: Emesis   Dustin Yang is a 33 y.o. male who presents today for evaluation of 24 hours of N/V/D. He reports that his young son was sick earlier this week and with similar symptoms.  The symptoms persisted for approximately 36-48 hours with complete resolution.  He states that yesterday he was at work when he began to feel weak like he was going to be sick.  He then drove home took a hot shower and went to sleep and woke up at approximately 2 PM with significant vomiting.  He then began having diarrhea shortly after however his main complaint is the persistent nausea and vomiting.  He has not eaten or been able to hold down any fluids since lunchtime yesterday.  He does daily however reports no correlation between this and his symptoms.  He is also concerned about left rib pain.  He reports that he occasionally feels this pain after he works out however yesterday he noticed a sharp pain on his left lower rib cage and now experiences pain when he takes a deep breath or throws up.  He denies fever, hematochezia, melena, hematemesis, central chest pain, shortness of breath, or abdominal pain.  He does report feelings of being lightheaded and dizzy.  Emesis Associated symptoms: chills and diarrhea   Associated symptoms: no abdominal pain and no fever        Prior to Admission medications   Medication Sig Start Date End Date Taking? Authorizing Provider  ondansetron (ZOFRAN-ODT) 4 MG disintegrating tablet Take 1 tablet (4 mg total) by mouth every 6 (six) hours as needed for nausea or vomiting. 02/16/24  Yes Emerita Berkemeier, DO  Ascorbic Acid (VITAMIN C PO) Take 2 tablets by mouth daily.    [provider]  Diphenhydramine -APAP, sleep, (TYLENOL  DAY/NIGHT EX ST PO) Take 2 tablets by mouth daily as needed (cold symptoms).    [provider]  DM-Phenylephrine-Acetaminophen  (THERAFLU EXPRESSMAX) 10-5-325 MG/15ML LIQD Take 1 Dose by mouth 3 (three) times daily as needed (cold symptoms).    [provider]  EPINEPHrine (EPIPEN 2-PAK) 0.3 mg/0.3 mL IJ SOAJ injection Inject 0.3 mLs into the muscle once as needed (allergic reaction).  04/20/14   [provider]  HYDROcodone -acetaminophen  (NORCO) 5-325 MG tablet Take 1-2 tablets by mouth every 4 (four) hours as needed. 09/08/15   Josem Bring, MD  metoCLOPramide  (REGLAN ) 10 MG tablet Take 1 tablet (10 mg total) by mouth every 6 (six) hours as needed for nausea (nausea/headache). 09/08/15   Josem Bring, MD  naproxen sodium (ANAPROX) 220 MG tablet Take 440-660 mg by mouth daily as needed (pain).    [provider]    Allergies: Bee venom    Review of Systems  Constitutional:  Positive for appetite change and chills. Negative for activity change and fever.  Respiratory:  Negative for chest tightness and shortness of breath.   Cardiovascular:  Negative for chest pain.  Gastrointestinal:  Positive for diarrhea, nausea and vomiting. Negative for abdominal distention, abdominal pain, anal bleeding, blood in stool and constipation.  Genitourinary:  Negative for dysuria, hematuria and penile pain.  Musculoskeletal:        Pain over left ribcage.   Neurological:  Positive for dizziness and light-headedness.    Updated Vital Signs BP (!) 148/76 (BP Location: Left Arm)   Pulse 65  Temp 98.2 F (36.8 C)   Resp 16   Ht 5' 9 (1.753 m)   Wt 77.3 kg   SpO2 99%   BMI 25.17 kg/m   Physical Exam Constitutional:      General: He is not in acute distress.    Appearance: Normal appearance. He is not ill-appearing or toxic-appearing.  HENT:     Head: Normocephalic and atraumatic.  Cardiovascular:     Rate and Rhythm: Normal rate and regular rhythm.     Pulses: Normal pulses.     Heart sounds: Normal heart sounds.  Pulmonary:     Effort: Pulmonary effort is  normal.     Breath sounds: Normal breath sounds.  Abdominal:     General: Abdomen is flat. There is no distension.     Palpations: Abdomen is soft.     Tenderness: There is no abdominal tenderness. There is no guarding.  Musculoskeletal:     Comments: Ttp over left lower rib cage.   Neurological:     Mental Status: He is alert.  Psychiatric:        Mood and Affect: Mood normal.     (all labs ordered are listed, but only abnormal results are displayed) Labs Reviewed  COMPREHENSIVE METABOLIC PANEL WITH GFR - Abnormal; Notable for the following components:      Result Value   Glucose, Bld 131 (*)    BUN 24 (*)    Total Protein 8.6 (*)    All other components within normal limits  URINALYSIS, ROUTINE W REFLEX MICROSCOPIC - Abnormal; Notable for the following components:   Specific Gravity, Urine 1.038 (*)    Hgb urine dipstick MODERATE (*)    Bilirubin Urine SMALL (*)    Ketones, ur 40 (*)    Protein, ur 30 (*)    All other components within normal limits  LIPASE, BLOOD  CBC    EKG: EKG Interpretation Date/Time:  Wednesday February 16 2024 14:39:06 EST Ventricular Rate:  85 PR Interval:  144 QRS Duration:  70 QT Interval:  344 QTC Calculation: 409 R Axis:   75  Text Interpretation: Normal sinus rhythm Nonspecific T wave abnormality Abnormal ECG No previous ECGs available Confirmed by Dreama Longs ([id number]) on 02/16/2024 4:42:45 PM   Medications Ordered in the ED  sodium chloride 0.9 % bolus 1,000 mL (has no administration in time range)  lidocaine (LIDODERM) 5 % 1 patch (1 patch Transdermal Patch Applied 02/16/24 1645)  ondansetron (ZOFRAN) injection 4 mg (4 mg Intravenous Given 02/16/24 1443)    #Gastroenteritis The patient presented today with 24 hours of n/v/d.  He reports that his young son had been sick over the weekend with similar symptoms which spontaneously resolved within 36 to 48 hours.  He did present to the ER today because he was feeling lightheaded  after being unable to tolerate any form of fluids or p.o. intake since noon yesterday.  Spoke cannabis daily however at this time low suspicion for cannabinoid hyperemesis syndrome as his son is sick with similar symptoms and his symptoms seem to be unrelated to smoking. CMP and CBC grossly unremarkable.  UA with specific gravity of 1.038 and ketones, likely secondary to dehydration. Patient reports he is feeling significantly better after a dose of Zofran.  Will p.o. challenge and reevaluate. Upon re-evaluation, patient feeling much better and tolerating PO intake. Pt stable for discharge at this time. Return precautions discussed.   #Left Rib Pain #Costochondritis The patient is reporting left rib pain over  anterior side of the left lower rib cage.  He reports that this happens occasionally when he works out however did start reoccurring yesterday when he was throwing up.  It is tender to palpation.  Suspect this is musculoskeletal in nature likely costochondritis.  Minimal concern for a pulmonary cause of chest pain.  Will place lidocaine patch and reevaluate. Upon re-evaluation, patient reports improvement of pain. He is stable for discharge at this time with strict return precautions   Final diagnoses:  Gastroenteritis  Costochondritis    ED Discharge Orders          Ordered    ondansetron (ZOFRAN-ODT) 4 MG disintegrating tablet  Every 6 hours PRN        02/16/24 1701            KANDICE Novak, Alisan Dokes, DO 02/16/24 1704    Dreama Longs, MD 02/17/24 1516

## 2024-02-16 NOTE — ED Notes (Signed)
 Gatorade and water provided for PO challenge.

## 2024-02-16 NOTE — Discharge Instructions (Addendum)
 Dustin Yang was seen today for gasteoenteritis (the stomach flu). He was given fluids and zofran for nausea. He is stable to go home at this point.  We did prescribe zofran for nausea--take 1 tablet every 6 hours as needed for nausea. Continue to drink plenty of fluids with electrolytes.  Return to the ER if you begin experiencing blood in your vomit or stool or continue to be unable to tolerate and fluids.

## 2024-02-16 NOTE — ED Notes (Signed)
 PO challenge 1629

## 2024-02-16 NOTE — ED Triage Notes (Signed)
 Patient states vomiting since last night with pain in his lower left rib while throwing up. Son sick at home.
